# Patient Record
Sex: Female | Born: 1980 | Race: White | Hispanic: No | Marital: Single | Smoking: Never smoker
Health system: Southern US, Community
[De-identification: ages and names within clinical notes are randomized; demographics above are authoritative.]

---

## 2017-05-11 ENCOUNTER — Encounter (HOSPITAL_COMMUNITY): Payer: Self-pay | Admitting: *Deleted

## 2017-05-11 ENCOUNTER — Other Ambulatory Visit: Payer: Self-pay

## 2017-05-11 ENCOUNTER — Emergency Department (HOSPITAL_COMMUNITY)
Admission: EM | Admit: 2017-05-11 | Discharge: 2017-05-11 | Disposition: A | Discharge status: Home / Self Care | Attending: Emergency Medicine | Admitting: Emergency Medicine

## 2017-05-11 DIAGNOSIS — Y929 Unspecified place or not applicable: Secondary | ICD-10-CM | POA: Diagnosis not present

## 2017-05-11 DIAGNOSIS — W260XXA Contact with knife, initial encounter: Secondary | ICD-10-CM | POA: Diagnosis not present

## 2017-05-11 DIAGNOSIS — Y939 Activity, unspecified: Secondary | ICD-10-CM | POA: Diagnosis not present

## 2017-05-11 DIAGNOSIS — Z23 Encounter for immunization: Secondary | ICD-10-CM | POA: Insufficient documentation

## 2017-05-11 DIAGNOSIS — Y999 Unspecified external cause status: Secondary | ICD-10-CM | POA: Insufficient documentation

## 2017-05-11 DIAGNOSIS — S61211A Laceration without foreign body of left index finger without damage to nail, initial encounter: Secondary | ICD-10-CM | POA: Diagnosis present

## 2017-05-11 MED ORDER — TETANUS-DIPHTH-ACELL PERTUSSIS 5-2.5-18.5 LF-MCG/0.5 IM SUSP
0.5000 mL | Freq: Once | INTRAMUSCULAR | Status: AC
  Administered 2017-05-11: 0.5 mL via INTRAMUSCULAR
  Filled 2017-05-11: qty 0.5

## 2017-05-11 MED ORDER — LIDOCAINE HCL (PF) 1 % IJ SOLN
5.0000 mL | Freq: Once | INTRAMUSCULAR | Status: AC
  Administered 2017-05-11: 5 mL
  Filled 2017-05-11: qty 30

## 2017-05-11 NOTE — Discharge Instructions (Addendum)
You were seen in the emergency department today for a laceration to your left second finger.  Wound was clean.  We placed a 6 stitches into the laceration to close it.  Do not get the area wet for the next 24 hours, do not soak the laceration until the stitches are removed.  You will need to have the stitches removed in 7 days, you may have this done at the ER, in urgent care, or by your primary care doctor.  You need to return to the ER sooner for any signs of infection such as redness around the wound, discharge from the wound, fever, chills, or any other concerns that you may have.

## 2017-05-11 NOTE — ED Provider Notes (Signed)
[facility]-EMERGENCY DEPT Provider Note   CSN: [id number] Arrival date & time: 05/11/17  [id number]     History   Chief Complaint Chief Complaint  Patient presents with  . Extremity Laceration    left index finger    HPI Hayley Black is a 37 y.o. female who presents to the ED for laceration to her L 2nd digit that occurred just PTA. Patient was at work utilizing a box cutter when it slipped and cut her finger. States she applied pressure without resolution of bleeding. The area is somewhat uncomfortable, no alleviating/aggravating factors. Unknown last tetanus. Denies numbness or weakness. No other areas of injury. Patient is R hand dominant.  HPI  History reviewed. No pertinent past medical history.  There are no active problems to display for this patient.   History reviewed. No pertinent surgical history.   OB History   None      Home Medications    Prior to Admission medications   Medication Sig Start Date End Date Taking? Authorizing Provider  levonorgestrel-ethinyl estradiol (SEASONALE,INTROVALE,JOLESSA) 0.15-0.03 MG tablet Take 1 tablet by mouth daily. 04/22/17 04/22/18 Yes [provider]    Family History History reviewed. No pertinent family history.  Social History Social History   Tobacco Use  . Smoking status: Never Smoker  . Smokeless tobacco: Never Used  Substance Use Topics  . Alcohol use: Not Currently  . Drug use: Not on file     Allergies   Patient has no known allergies.   Review of Systems Review of Systems  Constitutional: Negative for chills and fever.  Skin: Positive for wound.  Neurological: Negative for weakness and numbness.     Physical Exam Updated Vital Signs BP (!) 142/93 (BP Location: Right Arm)   Pulse 99   Temp 98.4 F (36.9 C) (Oral)   Resp 18   SpO2 100%   Physical Exam  Constitutional: She appears well-developed and well-nourished. No distress.  HENT:  Head: Normocephalic and  atraumatic.  Eyes: Conjunctivae are normal. Right eye exhibits no discharge. Left eye exhibits no discharge.  Cardiovascular:  Pulses:      Radial pulses are 2+ on the right side, and 2+ on the left side.  Musculoskeletal:  Upper extremities: Patient has full range of motion at all joints in the upper extremities with the exception of limited left second digit DIP and PIP flexion secondary to pain.  Patient is able to perform some flexion at both of these joints.  No bony tenderness to palpation.  Neurological: She is alert.  Clear speech.  Sensation grossly intact to bilateral upper extremities.  Patient is able to perform okay sign, thumbs up, and cross second and third digits in bilateral upper extremities.  Skin: Capillary refill takes less than 2 seconds. Laceration (There is a 2.5 cm laceration to the dorsolateral aspect of the left second digit which occurs in the area of the proximal phalanx. no appreciable foreign body. ) noted.  Psychiatric: She has a normal mood and affect. Her behavior is normal. Thought content normal.  Nursing note and vitals reviewed.    ED Treatments / Results  Labs (all labs ordered are listed, but only abnormal results are displayed) Labs Reviewed - No data to display  EKG None  Radiology No results found.  Procedures .Marland Kitchen.Laceration Repair Date/Time: 05/11/2017 9:40 AM Performed by: Cherly AndersonPetrucelli, Yoshie Kosel R, PA-C Authorized by: Cherly AndersonPetrucelli, Laraya Pestka R, PA-C   Consent:    Consent obtained:  Verbal   Consent given  by:  Patient   Risks discussed:  Pain, infection, retained foreign body, poor cosmetic result, tendon damage, vascular damage, poor wound healing and nerve damage   Alternatives discussed:  No treatment Anesthesia (see MAR for exact dosages):    Anesthesia method:  Nerve block   Block needle gauge:  27 G   Block anesthetic:  Lidocaine 1% w/o epi   Block injection procedure:  Anatomic landmarks identified   Block outcome:  Anesthesia  achieved Laceration details:    Location:  Finger   Finger location:  L index finger   Length (cm):  2.5 Repair type:    Repair type:  Simple Pre-procedure details:    Preparation:  Patient was prepped and draped in usual sterile fashion Exploration:    Hemostasis achieved with:  Direct pressure and tourniquet   Wound exploration: wound explored through full range of motion and entire depth of wound probed and visualized     Contaminated: no   Treatment:    Area cleansed with:  Betadine   Amount of cleaning:  Standard   Irrigation solution:  Sterile saline   Irrigation volume:  1L   Irrigation method:  Pressure wash Skin repair:    Repair method:  Sutures   Suture size:  4-0   Suture material:  Nylon   Suture technique:  Simple interrupted   Number of sutures:  6 Approximation:    Approximation:  Close Post-procedure details:    Dressing:  Non-adherent dressing   Patient tolerance of procedure:  Tolerated well, no immediate complications   (including critical care time)  Medications Ordered in ED Medications  lidocaine (PF) (XYLOCAINE) 1 % injection 5 mL (5 mLs Infiltration Given by Other 05/11/17 0900)  Tdap (BOOSTRIX) injection 0.5 mL (0.5 mLs Intramuscular Given 05/11/17 0930)     Initial Impression / Assessment and Plan / ED Course  I have reviewed the triage vital signs and the nursing notes.  Pertinent labs & imaging results that were available during my care of the patient were reviewed by me and considered in my medical decision making (see chart for details).  Patient presents with L 2nd digit laceration. Pressure irrigation performed. Wound explored and base of wound visualized in a bloodless field without evidence of foreign body.  Repair per procedure note above. Tdap updated.  Patient has no comorbidities to effect normal wound healing. Pt discharged without antibiotics.  Discussed suture home care with patient and answered questions. Pt to follow-up for wound  check and suture removal in 7 days; they are to return to the ED sooner for signs of infection. Pt is hemodynamically stable with no complaints prior to dc.    Final Clinical Impressions(s) / ED Diagnoses   Final diagnoses:  Laceration of left index finger without foreign body without damage to nail, initial encounter    ED Discharge Orders    None       Cherly Anderson, PA-C 05/11/17 1556    Azalia Bilis, MD 05/13/17 1428

## 2017-05-11 NOTE — ED Triage Notes (Signed)
Pt cut her left index finger with a box cutter while cutting open pallet at work. Pt covered wound w/ gauze. Laceration is actively bleeding. Pt is unsure when she last had a tetanus vaccination.

## 2017-05-11 NOTE — ED Notes (Signed)
Bed: WTR5 Expected date:  Expected time:  Means of arrival:  Comments: 

## 2017-05-19 ENCOUNTER — Other Ambulatory Visit: Payer: Self-pay

## 2017-05-19 ENCOUNTER — Emergency Department (HOSPITAL_COMMUNITY)
Admission: EM | Admit: 2017-05-19 | Discharge: 2017-05-19 | Disposition: A | Discharge status: Home / Self Care | Attending: Emergency Medicine | Admitting: Emergency Medicine

## 2017-05-19 ENCOUNTER — Encounter (HOSPITAL_COMMUNITY): Payer: Self-pay | Admitting: *Deleted

## 2017-05-19 DIAGNOSIS — S61211D Laceration without foreign body of left index finger without damage to nail, subsequent encounter: Secondary | ICD-10-CM | POA: Insufficient documentation

## 2017-05-19 DIAGNOSIS — X58XXXD Exposure to other specified factors, subsequent encounter: Secondary | ICD-10-CM | POA: Diagnosis not present

## 2017-05-19 DIAGNOSIS — Z79899 Other long term (current) drug therapy: Secondary | ICD-10-CM | POA: Insufficient documentation

## 2017-05-19 DIAGNOSIS — Z4802 Encounter for removal of sutures: Secondary | ICD-10-CM | POA: Diagnosis not present

## 2017-05-19 NOTE — ED Triage Notes (Signed)
For nylon suture removal from left index finger. No issues reported, and the wound is approximated and healing.

## 2017-05-19 NOTE — ED Provider Notes (Signed)
[facility]-EMERGENCY DEPT Provider Note   CSN: [id number] Arrival date & time: 05/19/17  0748     History   Chief Complaint Chief Complaint  Patient presents with  . Suture / Staple Removal    HPI Hayley Black is a 37 y.o. female presenting for suture removal.  Patient states she had sutures placed on the ninth for a lac to the finger.  She has had no issues since, no drainage, significant pain, or bleeding.  No numbness or tingling of the finger, full active range of motion.  She reports some mild soreness.  No concerns at this time.  HPI  History reviewed. No pertinent past medical history.  There are no active problems to display for this patient.   No past surgical history on file.   OB History   None      Home Medications    Prior to Admission medications   Medication Sig Start Date End Date Taking? Authorizing Provider  levonorgestrel-ethinyl estradiol (SEASONALE,INTROVALE,JOLESSA) 0.15-0.03 MG tablet Take 1 tablet by mouth daily. 04/22/17 04/22/18  [provider]    Family History No family history on file.  Social History Social History   Tobacco Use  . Smoking status: Never Smoker  . Smokeless tobacco: Never Used  Substance Use Topics  . Alcohol use: Not Currently  . Drug use: Not on file     Allergies   Patient has no known allergies.   Review of Systems Review of Systems  Skin: Positive for wound.  Neurological: Negative for numbness.  Hematological: Does not bruise/bleed easily.     Physical Exam Updated Vital Signs BP 118/80 (BP Location: Right Arm)   Pulse 79   Temp 98.3 F (36.8 C) (Oral)   Resp 18   SpO2 100%   Physical Exam  Constitutional: She is oriented to person, place, and time. She appears well-developed and well-nourished. No distress.  HENT:  Head: Normocephalic and atraumatic.  Eyes: EOM are normal.  Neck: Normal range of motion.  Pulmonary/Chest: Effort normal.  Abdominal:  She exhibits no distension.  Musculoskeletal: Normal range of motion.  Neurological: She is alert and oriented to person, place, and time.  Skin: Skin is warm. No rash noted.  Well-healing laceration of the index finger without surrounding erythema, drainage, or bleeding.  Psychiatric: She has a normal mood and affect.  Nursing note and vitals reviewed.    ED Treatments / Results  Labs (all labs ordered are listed, but only abnormal results are displayed) Labs Reviewed - No data to display  EKG None  Radiology No results found.  Procedures .Suture Removal Date/Time: 05/19/2017 9:02 AM Performed by: Alveria Apley, PA-C Authorized by: Alveria Apley, PA-C   Consent:    Consent obtained:  Verbal   Consent given by:  Patient   Risks discussed:  Wound separation, pain and bleeding Location:    Location:  Upper extremity   Upper extremity location:  Hand   Hand location:  L index finger Procedure details:    Wound appearance:  No signs of infection, good wound healing, clean and nontender   Number of sutures removed:  6 Post-procedure details:    Post-removal:  Dressing applied   Patient tolerance of procedure:  Tolerated well, no immediate complications   (including critical care time)  Medications Ordered in ED Medications - No data to display   Initial Impression / Assessment and Plan / ED Course  I have reviewed the triage vital signs and the nursing  notes.  Pertinent labs & imaging results that were available during my care of the patient were reviewed by me and considered in my medical decision making (see chart for details).     Patient presenting for evaluation of suture removal.  Physical exam shows well-healing laceration without signs of infection.  Sutures removed, patient tolerated well.  No wound dehiscence or significant bleeding.  Discussed aftercare instructions and follow-up with PCP as needed.  At this time, patient appears safe for discharge.   Return precautions given.  Patient states she understands and agrees to plan.   Final Clinical Impressions(s) / ED Diagnoses   Final diagnoses:  Visit for suture removal    ED Discharge Orders    None       Alveria ApleyCaccavale, Veola Cafaro, PA-C 05/19/17 [id number]    Azalia Bilisampos, Kevin, MD 05/19/17 1650

## 2017-05-19 NOTE — Discharge Instructions (Addendum)
Keep the area clean and covered as needed.  Follow up with your primary care as needed.  Return to the ER if you develop fevers, bleeding, numbness, or any new or concerning symptoms.

## 2018-01-31 ENCOUNTER — Ambulatory Visit (INDEPENDENT_AMBULATORY_CARE_PROVIDER_SITE_OTHER): Payer: Managed Care, Other (non HMO) | Admitting: Podiatry

## 2018-01-31 ENCOUNTER — Other Ambulatory Visit: Payer: Self-pay | Admitting: Podiatry

## 2018-01-31 ENCOUNTER — Ambulatory Visit (INDEPENDENT_AMBULATORY_CARE_PROVIDER_SITE_OTHER): Payer: Managed Care, Other (non HMO)

## 2018-01-31 ENCOUNTER — Encounter: Payer: Self-pay | Admitting: Podiatry

## 2018-01-31 VITALS — BP 120/80 | HR 95

## 2018-01-31 DIAGNOSIS — M722 Plantar fascial fibromatosis: Secondary | ICD-10-CM

## 2018-01-31 DIAGNOSIS — M79672 Pain in left foot: Secondary | ICD-10-CM

## 2018-01-31 NOTE — Patient Instructions (Signed)
Pre-Operative Instructions  Congratulations, you have decided to take an important step towards improving your quality of life.  You can be assured that the doctors and staff at [facility] will be with you every step of the way.  Here are some important things you should know:  1. Plan to be at the surgery center/hospital at least 1 (one) hour prior to your scheduled time, unless otherwise directed by the surgical center/hospital staff.  You must have a responsible adult accompany you, remain during the surgery and drive you home.  Make sure you have directions to the surgical center/hospital to ensure you arrive on time. 2. If you are having surgery at [facility] or [facility] hospitals, you will need a copy of your medical history and physical form from your family physician within one month prior to the date of surgery. We will give you a form for your primary physician to complete.  3. We make every effort to accommodate the date you request for surgery.  However, there are times where surgery dates or times have to be moved.  We will contact you as soon as possible if a change in schedule is required.   4. No aspirin/ibuprofen for one week before surgery.  If you are on aspirin, any non-steroidal anti-inflammatory medications (Mobic, Aleve, Ibuprofen) should not be taken seven (7) days prior to your surgery.  You make take Tylenol for pain prior to surgery.  5. Medications - If you are taking daily heart and blood pressure medications, seizure, reflux, allergy, asthma, anxiety, pain or diabetes medications, make sure you notify the surgery center/hospital before the day of surgery so they can tell you which medications you should take or avoid the day of surgery. 6. No food or drink after midnight the night before surgery unless directed otherwise by surgical center/hospital staff. 7. No alcoholic beverages 24-hours prior to surgery.  No smoking 24-hours prior or 24-hours after  surgery. 8. Wear loose pants or shorts. They should be loose enough to fit over bandages, boots, and casts. 9. Don't wear slip-on shoes. Sneakers are preferred. 10. Bring your boot with you to the surgery center/hospital.  Also bring crutches or a walker if your physician has prescribed it for you.  If you do not have this equipment, it will be provided for you after surgery. 11. If you have not been contacted by the surgery center/hospital by the day before your surgery, call to confirm the date and time of your surgery. 12. Leave-time from work may vary depending on the type of surgery you have.  Appropriate arrangements should be made prior to surgery with your employer. 13. Prescriptions will be provided immediately following surgery by your doctor.  Fill these as soon as possible after surgery and take the medication as directed. Pain medications will not be refilled on weekends and must be approved by the doctor. 14. Remove nail polish on the operative foot and avoid getting pedicures prior to surgery. 15. Wash the night before surgery.  The night before surgery wash the foot and leg well with water and the antibacterial soap provided. Be sure to pay special attention to beneath the toenails and in between the toes.  Wash for at least three (3) minutes. Rinse thoroughly with water and dry well with a towel.  Perform this wash unless told not to do so by your physician.  Enclosed: 1 Ice pack (please put in freezer the night before surgery)   1 Hibiclens skin cleaner     Pre-op instructions  If you have any questions regarding the instructions, please do not hesitate to call our office.  [place]: [address], [place], [place] [address] -- [phone]  [place]: [address] [place], [place] [address] -- [phone]  [place]: [address]  [place], [place] [address] -- [phone]  [place]: [address], [address], [place],  [address] -- [phone]  Website:  [email or web address] 

## 2018-02-02 NOTE — Progress Notes (Signed)
   Subjective: 38 year old female presenting today as a new patient, referred by Dr. Elijah Birk, with a chief complaint of constant nagging pain to the plantar aspect of the left heel that began 1-2 years ago. She states she has had injections and gotten insoles from Dr. Elijah Birk which have not provided any relief. She states she is on her feet all day which makes the pain worse. Patient is here for further evaluation and treatment.   History reviewed. No pertinent past medical history.    Objective: Physical Exam General: The patient is alert and oriented x3 in no acute distress.  Dermatology: Skin is warm, dry and supple bilateral lower extremities. Negative for open lesions or macerations bilateral.   Vascular: Dorsalis Pedis and Posterior Tibial pulses palpable bilateral.  Capillary fill time is immediate to all digits.  Neurological: Epicritic and protective threshold intact bilateral.   Musculoskeletal: Tenderness to palpation to the plantar aspect of the left heel along the plantar fascia. All other joints range of motion within normal limits bilateral. Strength 5/5 in all groups bilateral.   Radiographic exam:   Normal osseous mineralization. Joint spaces preserved. No fracture/dislocation/boney destruction. No other soft tissue abnormalities or radiopaque foreign bodies.   Assessment: 1. Plantar fasciitis left foot  Plan of Care:  1. Patient evaluated. Xrays reviewed.   2. Today we discussed the conservative versus surgical management of the presenting pathology. The patient opts for surgical management. All possible complications and details of the procedure were explained. All patient questions were answered. No guarantees were expressed or implied. 3. Authorization for surgery was initiated today. Surgery will consist of EPF left.  4. Return to clinic one week post op.     Felecia Shelling, DPM [facility]  Dr. Felecia Shelling, DPM    [address] [place], [place] [address]                Office [phone]  Fax [phone]

## 2018-03-02 DIAGNOSIS — M79676 Pain in unspecified toe(s): Secondary | ICD-10-CM

## 2018-03-24 ENCOUNTER — Encounter: Payer: Self-pay | Admitting: Podiatry

## 2018-03-24 ENCOUNTER — Other Ambulatory Visit: Payer: Self-pay | Admitting: Podiatry

## 2018-03-24 DIAGNOSIS — M722 Plantar fascial fibromatosis: Secondary | ICD-10-CM

## 2018-03-24 MED ORDER — OXYCODONE-ACETAMINOPHEN 5-325 MG PO TABS: 1.0000 | ORAL_TABLET | Freq: Four times a day (QID) | ORAL | 0 refills | Status: DC | PRN

## 2018-03-24 NOTE — Progress Notes (Signed)
.  postop

## 2018-04-04 ENCOUNTER — Ambulatory Visit (INDEPENDENT_AMBULATORY_CARE_PROVIDER_SITE_OTHER): Payer: Self-pay | Admitting: Podiatry

## 2018-04-04 DIAGNOSIS — M722 Plantar fascial fibromatosis: Secondary | ICD-10-CM

## 2018-04-04 DIAGNOSIS — Z9889 Other specified postprocedural states: Secondary | ICD-10-CM

## 2018-04-06 NOTE — Progress Notes (Signed)
   Subjective:  Patient presents today status post EPF left. DOS: 03/24/2018. She reports some soreness to the plantar aspect of the left foot. She states the pain has improved from what it was prior to surgery. She denies modifying factors. She has been using the CAM boot as directed. She denies fever, chills, nausea or vomiting. Patient is here for further evaluation and treatment.    No past medical history on file.    Objective/Physical Exam Neurovascular status intact.  Skin incisions appear to be well coapted with sutures and staples intact. No sign of infectious process noted. No dehiscence. No active bleeding noted. Moderate edema noted to the surgical extremity.  Assessment: 1. s/p EPF left. DOS: 03/24/2018   Plan of Care:  1. Patient was evaluated. 2. Recommended antibiotic ointment daily with a bandage.  3. Continue weightbearing in CAM boot.  4. Return to clinic in one week for suture removal.    Felecia Shelling, DPM [facility]  Dr. Felecia Shelling, DPM    [address]                                        [place], [place] [address]                Office [phone]  Fax [phone]

## 2018-04-11 ENCOUNTER — Ambulatory Visit (INDEPENDENT_AMBULATORY_CARE_PROVIDER_SITE_OTHER): Payer: Self-pay | Admitting: Podiatry

## 2018-04-11 DIAGNOSIS — M722 Plantar fascial fibromatosis: Secondary | ICD-10-CM

## 2018-04-11 DIAGNOSIS — Z9889 Other specified postprocedural states: Secondary | ICD-10-CM

## 2018-04-12 ENCOUNTER — Encounter: Payer: Self-pay | Admitting: Podiatry

## 2018-04-13 NOTE — Telephone Encounter (Signed)
Patient can begin transitioning out of the boot into good tennis shoes over the next 2 weeks.

## 2018-04-14 NOTE — Progress Notes (Signed)
   Subjective:  Patient presents today status post EPF left. DOS: 03/24/2018. She states she is doing well overall. She reports some intermittent soreness that is mild in nature. She has been using the CAM boot as directed. Patient is here for further evaluation and treatment.    No past medical history on file.    Objective/Physical Exam Neurovascular status intact.  Skin incisions appear to be well coapted with sutures and staples intact. No sign of infectious process noted. No dehiscence. No active bleeding noted. Moderate edema noted to the surgical extremity.  Assessment: 1. s/p EPF left. DOS: 03/24/2018   Plan of Care:  1. Patient was evaluated. 2. Sutures removed.  3. Transition out of CAM boot into good shoe gear in two weeks.  4. Compression anklet dispensed.  5. Return to clinic in 4 weeks. At that time we will return to work with restrictions.   Works at [employer] on [address].    Felecia Shelling, DPM [facility]  Dr. Felecia Shelling, DPM    [address]                                        [place], [place] [address]                Office [phone]  Fax [phone]

## 2018-04-25 ENCOUNTER — Encounter: Payer: Managed Care, Other (non HMO) | Admitting: Podiatry

## 2018-05-09 ENCOUNTER — Other Ambulatory Visit: Payer: Self-pay

## 2018-05-09 ENCOUNTER — Ambulatory Visit (INDEPENDENT_AMBULATORY_CARE_PROVIDER_SITE_OTHER): Payer: Managed Care, Other (non HMO) | Admitting: Podiatry

## 2018-05-09 ENCOUNTER — Encounter: Payer: Self-pay | Admitting: Podiatry

## 2018-05-09 VITALS — Temp 97.7°F

## 2018-05-09 DIAGNOSIS — Z9889 Other specified postprocedural states: Secondary | ICD-10-CM

## 2018-05-09 DIAGNOSIS — M722 Plantar fascial fibromatosis: Secondary | ICD-10-CM

## 2018-05-09 NOTE — Progress Notes (Signed)
   Subjective:  Patient presents today status post EPF left. DOS: 03/24/2018. She states she is doing well overall. She reports some intermittent soreness that is mild in nature.  She is recently noticed some pain to the lateral aspect of the left forefoot when walking barefoot around the house.  She has slowly been transitioning out of the immobilization cam boot    No past medical history on file.    Objective/Physical Exam Neurovascular status intact.  Skin incisions appear to be well coapted. No sign of infectious process noted. Moderate edema noted to the surgical extremity.  Negative for any tenderness to palpation along the insertion of the plantar fascia along the plantar heel.  Minimal tenderness to palpation overlying the neck of the fifth metatarsal left foot  Assessment: 1. s/p EPF left. DOS: 03/24/2018   Plan of Care:  1. Patient was evaluated. 2.  Patient may now resume full activity no restrictions beginning 05/20/2018 when she returns to work.  If the patient does have some residual tenderness and pain and is uncomfortable returning to work she may need an additional 4 weeks refraining from work to recover.  She will call into our office if she needs to have that extended. 3.  Resume good supportive tennis shoes.  Discontinue the immobilization cam boot 4.  Return to clinic as needed  Works at [employer] on [address].    Felecia Shelling, DPM [facility]  Dr. Felecia Shelling, DPM    [address]                                        [place], [place] [address]                Office [phone]  Fax [phone]

## 2018-05-10 ENCOUNTER — Encounter: Payer: Self-pay | Admitting: Podiatry

## 2018-05-16 ENCOUNTER — Telehealth: Payer: Self-pay | Admitting: Podiatry

## 2018-05-16 NOTE — Telephone Encounter (Signed)
error 

## 2018-06-15 ENCOUNTER — Ambulatory Visit (INDEPENDENT_AMBULATORY_CARE_PROVIDER_SITE_OTHER): Payer: Managed Care, Other (non HMO) | Admitting: Podiatry

## 2018-06-15 ENCOUNTER — Encounter: Payer: Self-pay | Admitting: Podiatry

## 2018-06-15 ENCOUNTER — Other Ambulatory Visit: Payer: Self-pay

## 2018-06-15 VITALS — Temp 97.5°F

## 2018-06-15 DIAGNOSIS — M779 Enthesopathy, unspecified: Secondary | ICD-10-CM

## 2018-06-15 DIAGNOSIS — M722 Plantar fascial fibromatosis: Secondary | ICD-10-CM

## 2018-06-15 DIAGNOSIS — Z9889 Other specified postprocedural states: Secondary | ICD-10-CM

## 2018-06-15 DIAGNOSIS — M778 Other enthesopathies, not elsewhere classified: Secondary | ICD-10-CM

## 2018-06-15 MED ORDER — METHYLPREDNISOLONE 4 MG PO TBPK: ORAL_TABLET | ORAL | 0 refills | Status: AC

## 2018-06-19 NOTE — Progress Notes (Signed)
   Subjective:  Patient presents today status post EPF left. DOS: 03/24/2018. She states she went back to work one week ago and after a couple of days she started experiencing significant pain again. Standing and walking for long periods of time increases the pain. She has been taking Duexis for treatment. Patient is here for further evaluation and treatment.    History reviewed. No pertinent past medical history.    Objective/Physical Exam Neurovascular status intact.  Skin incisions appear to be well coapted. No sign of infectious process noted. Moderate edema noted to the surgical extremity. Pain with palpation noted to the left foot.   Assessment: 1. s/p EPF left. DOS: 03/24/2018 2. Capsulitis left foot secondary to overuse injury    Plan of Care:  1. Patient was evaluated. 2. Prescription for Medrol Dose Pak provided to patient. Then continue taking Duexis from Dr. Elijah Birk.  3. Resume using custom orthotics from Dr. Tasia Catchings office.  4. Compression anklet dispensed.  5. Restricted work hours. 5 hour shifts for four weeks.  6. Return to clinic in 4 weeks.    Works at [employer] on [address].    Felecia Shelling, DPM [facility]  Dr. Felecia Shelling, DPM    [address]                                        [place], [place] [address]                Office [phone]  Fax [phone]

## 2018-07-20 ENCOUNTER — Ambulatory Visit (INDEPENDENT_AMBULATORY_CARE_PROVIDER_SITE_OTHER): Payer: Managed Care, Other (non HMO)

## 2018-07-20 ENCOUNTER — Ambulatory Visit (INDEPENDENT_AMBULATORY_CARE_PROVIDER_SITE_OTHER): Payer: Managed Care, Other (non HMO) | Admitting: Podiatry

## 2018-07-20 ENCOUNTER — Encounter: Payer: Self-pay | Admitting: Podiatry

## 2018-07-20 ENCOUNTER — Other Ambulatory Visit: Payer: Self-pay

## 2018-07-20 ENCOUNTER — Other Ambulatory Visit: Payer: Self-pay | Admitting: Podiatry

## 2018-07-20 VITALS — Temp 97.9°F

## 2018-07-20 DIAGNOSIS — M779 Enthesopathy, unspecified: Secondary | ICD-10-CM

## 2018-07-20 DIAGNOSIS — M7672 Peroneal tendinitis, left leg: Secondary | ICD-10-CM

## 2018-07-23 NOTE — Progress Notes (Signed)
   HPI: 38 year old female presenting today for follow up evaluation of left foot pain. He states the pain has not changed since his last visit. He denies any modifying factors. He has been taking Duexis and using the custom orthotics he received from Dr. Gershon Mussel. Patient is here for further evaluation and treatment.   History reviewed. No pertinent past medical history.   Physical Exam: General: The patient is alert and oriented x3 in no acute distress.  Dermatology: Skin is warm, dry and supple bilateral lower extremities. Negative for open lesions or macerations.  Vascular: Palpable pedal pulses bilaterally. No edema or erythema noted. Capillary refill within normal limits.  Neurological: Epicritic and protective threshold grossly intact bilaterally.   Musculoskeletal Exam: Pain with palpation to the insertion of the peroneal tendon of the left foot. Range of motion within normal limits to all pedal and ankle joints bilateral. Muscle strength 5/5 in all groups bilateral.   Radiographic Exam:  Normal osseous mineralization. Joint spaces preserved. No fracture/dislocation/boney destruction.    Assessment: 1. Insertional peroneal tendinitis left    Plan of Care:  1. Patient evaluated. X-Rays reviewed.  2. Injection of 0.5 mLs Celestone Soluspan injected into the insertion of the peroneal tendon sheath of the left foot.  3. Continue using custom orthotics from Dr. Lindley Magnus office.  4. Continue taking Duexis as needed.  5. Restricted work hours. Five hour shifts for four weeks.  6. Return to clinic in 4 weeks.   Works at [employer] on [employer].      [name], DPM [facility]  Dr. [name], DPM    [address], [place] [address]                Office [phone]  Fax [phone]

## 2018-08-10 ENCOUNTER — Encounter: Payer: Self-pay | Admitting: Podiatry

## 2018-08-10 ENCOUNTER — Telehealth: Payer: Self-pay | Admitting: *Deleted

## 2018-08-10 ENCOUNTER — Ambulatory Visit (INDEPENDENT_AMBULATORY_CARE_PROVIDER_SITE_OTHER): Payer: Managed Care, Other (non HMO) | Admitting: Podiatry

## 2018-08-10 ENCOUNTER — Other Ambulatory Visit: Payer: Self-pay

## 2018-08-10 VITALS — Temp 97.4°F

## 2018-08-10 DIAGNOSIS — M7672 Peroneal tendinitis, left leg: Secondary | ICD-10-CM | POA: Diagnosis not present

## 2018-08-10 DIAGNOSIS — M722 Plantar fascial fibromatosis: Secondary | ICD-10-CM

## 2018-08-10 DIAGNOSIS — M779 Enthesopathy, unspecified: Secondary | ICD-10-CM

## 2018-08-10 NOTE — Telephone Encounter (Signed)
Orders to J. Quintana, RN for pre-cert and faxed to [facility]. 

## 2018-08-10 NOTE — Telephone Encounter (Signed)
-----   Message from [name], DPM sent at 08/10/2018 10:51 AM EDT ----- Regarding: MRI left foot Please order MRI left foot w/out contrast.   Dx: peroneal tendinitis left  Thanks, Dr. Amalia Hailey

## 2018-08-15 NOTE — Progress Notes (Signed)
   HPI: 38 year old female presenting today for follow up evaluation of insertional peroneal tendinitis of the left foot. She reports continued pain. She states the injection provided relief for about one week. Standing and walking for long periods of time increases the pain. She has been taking Duexis for pain. Patient is here for further evaluation and treatment.    History reviewed. No pertinent past medical history.   Physical Exam: General: The patient is alert and oriented x3 in no acute distress.  Dermatology: Skin is warm, dry and supple bilateral lower extremities. Negative for open lesions or macerations.  Vascular: Palpable pedal pulses bilaterally. No edema or erythema noted. Capillary refill within normal limits.  Neurological: Epicritic and protective threshold grossly intact bilaterally.   Musculoskeletal Exam: Pain with palpation to the insertion of the peroneal tendon of the left foot. Range of motion within normal limits to all pedal and ankle joints bilateral. Muscle strength 5/5 in all groups bilateral.   Assessment: 1. Insertional peroneal tendinitis left    Plan of Care:  1. Patient evaluated.  2. Injection of 0.5 mLs Celestone Soluspan injected into the insertion of the peroneal tendon sheath of the left foot.  3. Order for MRI of the left foot placed.  4. Return to clinic in 4 weeks.    Works at [employer] on [employer].      [name], DPM [facility]  Dr. [name], DPM    [address], [place] [address]                Office [phone]  Fax [phone]

## 2018-08-25 ENCOUNTER — Other Ambulatory Visit: Payer: Self-pay

## 2018-09-06 NOTE — Telephone Encounter (Addendum)
[employer] states MRI denied due to 6 weeks of physician directed PT has not been performed. I informed pt that the 1st office visit concerning the peroneal tendonitis was 08/10/2018 and that would be the start time for the 6 weeks of "Physician Directed PT", so she would need to be reevaluated after 09/20/2018 for update of status to reapply for the MRI. Pt states has an appt tomorrow. I told pt that would be good, it would all for more information to be applied to the request for the MRI and then reevaluation after 09/20/2018. Pt states understanding.

## 2018-09-07 ENCOUNTER — Other Ambulatory Visit: Payer: Self-pay

## 2018-09-07 ENCOUNTER — Telehealth: Payer: Self-pay | Admitting: *Deleted

## 2018-09-07 ENCOUNTER — Encounter: Payer: Self-pay | Admitting: Podiatry

## 2018-09-07 ENCOUNTER — Ambulatory Visit (INDEPENDENT_AMBULATORY_CARE_PROVIDER_SITE_OTHER): Payer: Managed Care, Other (non HMO) | Admitting: Podiatry

## 2018-09-07 VITALS — Temp 98.1°F

## 2018-09-07 DIAGNOSIS — M7672 Peroneal tendinitis, left leg: Secondary | ICD-10-CM | POA: Diagnosis not present

## 2018-09-08 NOTE — Telephone Encounter (Signed)
-----   Message from [name], DPM sent at 09/07/2018 10:24 AM EDT ----- Regarding: MRI denied Hi Hayley Black,   I just saw this patient. Her MRI was denied. Could we arrange a peer-to-peer to see if we could get it approved?  Thanks, Dr. Amalia Hailey

## 2018-09-08 NOTE — Telephone Encounter (Addendum)
Carin Primrose transferred to Physician Support Team - Nikki Dom., he scheduled PEER TO PEER for 09/12/2018 at 12:00pm with DR. Sabeen Razzaq-Ahmed for Reference/Case:  [id number]. Faxed to [place] office and routed the [place] to Dr. Amalia Hailey.

## 2018-09-09 NOTE — Telephone Encounter (Signed)
Thank you :)

## 2018-09-10 NOTE — Progress Notes (Signed)
   HPI: 38 year old female presenting today for follow up evaluation of insertional peroneal tendinitis of the left foot. She states her pain is relatively unchanged. She was not approved for the MRI through insurance. She has been taking Duexis for pain and trying to stay off the foot as much as possible as this exacerbates her pain. Patient is here for further evaluation and treatment.    History reviewed. No pertinent past medical history.   Physical Exam: General: The patient is alert and oriented x3 in no acute distress.  Dermatology: Skin is warm, dry and supple bilateral lower extremities. Negative for open lesions or macerations.  Vascular: Palpable pedal pulses bilaterally. No edema or erythema noted. Capillary refill within normal limits.  Neurological: Epicritic and protective threshold grossly intact bilaterally.   Musculoskeletal Exam: Pain with palpation to the insertion of the peroneal tendon of the left foot. Range of motion within normal limits to all pedal and ankle joints bilateral. Muscle strength 5/5 in all groups bilateral.   Assessment: 1. Insertional peroneal tendinitis left    Plan of Care:  1. Patient evaluated.  2. Physical therapy three times weekly for four weeks.  3. MRI denied. I will attempt peer-to-peer.  4. Return to clinic in 4 weeks.     Works at [employer] on [employer].      [name], DPM [facility]  Dr. [name], DPM    [address], [place] [address]                Office [phone]  Fax [phone]

## 2018-09-12 NOTE — Telephone Encounter (Signed)
Faxed orders for the left foot MRI to [place] with the authorization. I informed pt the MRI had been authorized and she could call [place] [phone]. Pt asked if she needed to keep the PT appt tomorrow. Dr. Amalia Hailey stated to postpone the appt until the MRI was reviewed.

## 2018-09-12 NOTE — Telephone Encounter (Signed)
[facility] - Dr. Sharlene Dory Razzaq-Ahmed and Dr. Amalia Hailey performed PEER TO PEER. Authorization for the MRI 16579 left foot:  [id number].

## 2018-09-17 ENCOUNTER — Ambulatory Visit
Admission: RE | Admit: 2018-09-17 | Discharge: 2018-09-17 | Disposition: A | Discharge status: Home / Self Care | Payer: Self-pay | Source: Ambulatory Visit | Attending: Podiatry | Admitting: Podiatry

## 2018-09-17 ENCOUNTER — Other Ambulatory Visit: Payer: Self-pay

## 2018-10-05 ENCOUNTER — Other Ambulatory Visit: Payer: Self-pay

## 2018-10-05 ENCOUNTER — Ambulatory Visit (INDEPENDENT_AMBULATORY_CARE_PROVIDER_SITE_OTHER): Payer: Managed Care, Other (non HMO) | Admitting: Podiatry

## 2018-10-05 DIAGNOSIS — M7672 Peroneal tendinitis, left leg: Secondary | ICD-10-CM | POA: Diagnosis not present

## 2018-10-05 DIAGNOSIS — M7752 Other enthesopathy of left foot: Secondary | ICD-10-CM

## 2018-10-05 NOTE — Patient Instructions (Signed)
Pre-Operative Instructions  Congratulations, you have decided to take an important step towards improving your quality of life.  You can be assured that the doctors and staff at [facility] will be with you every step of the way.  Here are some important things you should know:  1. Plan to be at the surgery center/hospital at least 1 (one) hour prior to your scheduled time, unless otherwise directed by the surgical center/hospital staff.  You must have a responsible adult accompany you, remain during the surgery and drive you home.  Make sure you have directions to the surgical center/hospital to ensure you arrive on time. 2. If you are having surgery at [facility] or [facility], you will need a copy of your medical history and physical form from your family physician within one month prior to the date of surgery. We will give you a form for your primary physician to complete.  3. We make every effort to accommodate the date you request for surgery.  However, there are times where surgery dates or times have to be moved.  We will contact you as soon as possible if a change in schedule is required.   4. No aspirin/ibuprofen for one week before surgery.  If you are on aspirin, any non-steroidal anti-inflammatory medications (Mobic, Aleve, Ibuprofen) should not be taken seven (7) days prior to your surgery.  You make take Tylenol for pain prior to surgery.  5. Medications - If you are taking daily heart and blood pressure medications, seizure, reflux, allergy, asthma, anxiety, pain or diabetes medications, make sure you notify the surgery center/hospital before the day of surgery so they can tell you which medications you should take or avoid the day of surgery. 6. No food or drink after midnight the night before surgery unless directed otherwise by surgical center/hospital staff. 7. No alcoholic beverages 24-hours prior to surgery.  No smoking 24-hours prior or 24-hours after  surgery. 8. Wear loose pants or shorts. They should be loose enough to fit over bandages, boots, and casts. 9. Don't wear slip-on shoes. Sneakers are preferred. 10. Bring your boot with you to the surgery center/hospital.  Also bring crutches or a walker if your physician has prescribed it for you.  If you do not have this equipment, it will be provided for you after surgery. 11. If you have not been contacted by the surgery center/hospital by the day before your surgery, call to confirm the date and time of your surgery. 12. Leave-time from work may vary depending on the type of surgery you have.  Appropriate arrangements should be made prior to surgery with your employer. 13. Prescriptions will be provided immediately following surgery by your doctor.  Fill these as soon as possible after surgery and take the medication as directed. Pain medications will not be refilled on weekends and must be approved by the doctor. 14. Remove nail polish on the operative foot and avoid getting pedicures prior to surgery. 15. Wash the night before surgery.  The night before surgery wash the foot and leg well with water and the antibacterial soap provided. Be sure to pay special attention to beneath the toenails and in between the toes.  Wash for at least three (3) minutes. Rinse thoroughly with water and dry well with a towel.  Perform this wash unless told not to do so by your physician.  Enclosed: 1 Ice pack (please put in freezer the night before surgery)   1 Hibiclens skin cleaner     Pre-op instructions  If you have any questions regarding the instructions, please do not hesitate to call our office.  [place]: [address], [place], [place] [address] -- [phone]  [place]: [address] [place], [place] [address] -- [phone]  [place]: [address]  [place], [place] [address] -- [phone]  [place]: [address], [place], [place] [address] -- [phone]  Website:  [email or web address] 

## 2018-10-09 NOTE — Progress Notes (Signed)
   HPI: 38 year old female presenting today for follow up evaluation of left foot pain. She reports continued pain. She has been doing physical therapy as directed. She denies any modifying factors at this time. She had an MRI done on 09/17/2018. Patient is here for further evaluation and treatment.   No past medical history on file.   Physical Exam: General: The patient is alert and oriented x3 in no acute distress.  Dermatology: Skin is warm, dry and supple bilateral lower extremities. Negative for open lesions or macerations.  Vascular: Palpable pedal pulses bilaterally. No edema or erythema noted. Capillary refill within normal limits.  Neurological: Epicritic and protective threshold grossly intact bilaterally.   Musculoskeletal Exam: Pain with palpation to the peroneal tendon of the left foot. Range of motion within normal limits to all pedal and ankle joints bilateral. Muscle strength 5/5 in all groups bilateral.   MRI Impression:  1. 7 x 4 x 13 mm fluid collection between the os peroneum and distal peroneal brevis tendon which could represent a ganglion cyst or sequelae of chronic irritation related to the os peroneum. The peroneal tendons are unremarkable. 2. Prior plantar fascia release.  Assessment: 1. Peroneal tendinitis left  2. Os peroneum left    Plan of Care:  1. Patient evaluated. MRI reviewed.  2. Today we discussed the conservative versus surgical management of the presenting pathology. The patient opts for surgical management. All possible complications and details of the procedure were explained. All patient questions were answered. No guarantees were expressed or implied. 3. Authorization for surgery was initiated today. Surgery will consist of excision os peroneum left; possible repair peroneal tendon left.  4. Return to clinic one week post op.      Works at [employer] on [employer].      [name], DPM [facility]  Dr. [name], DPM     [address], [place] [address]                Office [phone]  Fax [phone]

## 2018-10-21 DIAGNOSIS — M79676 Pain in unspecified toe(s): Secondary | ICD-10-CM

## 2018-11-02 ENCOUNTER — Telehealth: Payer: Self-pay | Admitting: Podiatry

## 2018-11-02 NOTE — Telephone Encounter (Signed)
DOS: 11/17/2018 SURGICAL PROCEDURE: TARSAL EXOSTECTOMY LT, REPAIR PERONEAL TENDON LT CPT CODES: 17471, 28200 DX CODES: M77.52, M76.72   Per [facility] with [employer],  Patients In Network Deductible is $2,800 and $2,800 has been met. Patients Individual Out of Pocket is $3,900 and $3,266.90 has been met.               Family Out of Pocket is $7,900 and $3,292.72 has been met.  Benefits are 90% for insurance and 10% for patient. Will be 100% once individual or family out of pocket has been met.  No Pre Cert or Referrals are required.   Reference Number: [id number]

## 2018-11-08 ENCOUNTER — Telehealth: Payer: Self-pay | Admitting: *Deleted

## 2018-11-08 NOTE — Telephone Encounter (Signed)
"  I'm scheduled for surgery on Thursday of next week.  I haven't received a phone call about the time yet."  You will receive a call from someone from the surgical center a day or two prior to your surgery date.  They will give you your arrival time.  "Oh okay, thanks."

## 2018-11-17 ENCOUNTER — Encounter: Payer: Self-pay | Admitting: Podiatry

## 2018-11-17 ENCOUNTER — Other Ambulatory Visit: Payer: Self-pay | Admitting: Podiatry

## 2018-11-17 DIAGNOSIS — M25775 Osteophyte, left foot: Secondary | ICD-10-CM

## 2018-11-17 DIAGNOSIS — M7672 Peroneal tendinitis, left leg: Secondary | ICD-10-CM

## 2018-11-17 MED ORDER — OXYCODONE-ACETAMINOPHEN 5-325 MG PO TABS: 1.0000 | ORAL_TABLET | ORAL | 0 refills | Status: AC | PRN

## 2018-11-17 NOTE — Progress Notes (Signed)
.  postop

## 2018-11-18 ENCOUNTER — Other Ambulatory Visit: Payer: Self-pay

## 2018-11-18 ENCOUNTER — Ambulatory Visit: Payer: Managed Care, Other (non HMO)

## 2018-11-18 DIAGNOSIS — M7672 Peroneal tendinitis, left leg: Secondary | ICD-10-CM

## 2018-11-18 NOTE — Progress Notes (Signed)
Patient is here today concerned about postoperative bleeding.  She had surgery yesterday, and has noticed blood on her Ace bandage in her boot.  Noted moderate amount of blood to postoperative bandages, some images were removed, did not remove sterile field bandages.  Applied additional gauze along with compressive Ace wrap.  Advised to remain in the boot, discussed signs and symptoms of excessive bleeding.  She is to follow-up if any acute symptom changes.

## 2018-11-23 ENCOUNTER — Encounter: Payer: Self-pay | Admitting: Podiatry

## 2018-11-23 ENCOUNTER — Other Ambulatory Visit: Payer: Self-pay

## 2018-11-23 ENCOUNTER — Ambulatory Visit (INDEPENDENT_AMBULATORY_CARE_PROVIDER_SITE_OTHER): Payer: Self-pay | Admitting: Podiatry

## 2018-11-23 ENCOUNTER — Ambulatory Visit (INDEPENDENT_AMBULATORY_CARE_PROVIDER_SITE_OTHER): Payer: Managed Care, Other (non HMO)

## 2018-11-23 VITALS — Temp 98.2°F

## 2018-11-23 DIAGNOSIS — Z9889 Other specified postprocedural states: Secondary | ICD-10-CM | POA: Diagnosis not present

## 2018-11-23 DIAGNOSIS — M7752 Other enthesopathy of left foot: Secondary | ICD-10-CM

## 2018-11-23 DIAGNOSIS — M7672 Peroneal tendinitis, left leg: Secondary | ICD-10-CM

## 2018-11-27 NOTE — Progress Notes (Signed)
   Subjective:  Patient presents today status post excision of os peroneum left. DOS: 11/17/2018. She states she is doing well and improving. She has been taking Motrin as needed which has helped alleviate any pain. She denies worsening factors. Patient is here for further evaluation and treatment.    History reviewed. No pertinent past medical history.    Objective/Physical Exam Neurovascular status intact.  Skin incisions appear to be well coapted with sutures and staples intact. No sign of infectious process noted. No dehiscence. No active bleeding noted. Moderate edema noted to the surgical extremity.  Radiographic Exam:  Orthopedic hardware and osteotomies sites appear to be stable with routine healing.  Assessment: 1. s/p excision os peroneum left. DOS: 11/17/2018   Plan of Care:  1. Patient was evaluated. X-rays reviewed 2. Dressing changed. Keep clean, dry and intact for one week.  3. Continue weightbearing in CAM boot.  4. Return to clinic in one week.    [name], DPM [facility]  Dr. [name], [address]                                        [place], [place] [address]                Office [phone]  Fax [phone]

## 2018-11-28 ENCOUNTER — Telehealth: Payer: Self-pay | Admitting: *Deleted

## 2018-11-28 NOTE — Telephone Encounter (Signed)
"  I'm calling to see if Hayley Black had her surgery on 11/17/2018."  Yes, she did.  "Did she have a Peroneal Tendon Repair and a Tarsal Exostectomy?"  Yes, she did have those procedures.  "Perfect, has she already had a follow-up appointment?  If so, when did she have it?"  She had a follow-up appointment on 11/23/2018.  "That's all I needed to know."

## 2018-11-29 ENCOUNTER — Telehealth: Payer: Self-pay | Admitting: Podiatry

## 2018-11-30 ENCOUNTER — Other Ambulatory Visit: Payer: Self-pay

## 2018-11-30 ENCOUNTER — Encounter: Payer: Self-pay | Admitting: Podiatry

## 2018-11-30 ENCOUNTER — Ambulatory Visit (INDEPENDENT_AMBULATORY_CARE_PROVIDER_SITE_OTHER): Payer: Self-pay | Admitting: Podiatry

## 2018-11-30 DIAGNOSIS — M7752 Other enthesopathy of left foot: Secondary | ICD-10-CM

## 2018-11-30 DIAGNOSIS — Z9889 Other specified postprocedural states: Secondary | ICD-10-CM

## 2018-11-30 DIAGNOSIS — M7672 Peroneal tendinitis, left leg: Secondary | ICD-10-CM

## 2018-11-30 NOTE — Telephone Encounter (Signed)
Following up on a medical records request. We are needing the medical records from 11/17/2018 - Present. Please fax those with reference# [id number] to [phone].

## 2018-12-04 NOTE — Progress Notes (Signed)
   Subjective:  Patient presents today status post excision of os peroneum left. DOS: 11/17/2018. She states she is doing well and has improved since her last visit. She has been nonweightbearing in the CAM boot as directed. There are no modifying factors noted. Patient is here for further evaluation and treatment.    History reviewed. No pertinent past medical history.    Objective/Physical Exam Neurovascular status intact.  Skin incisions appear to be well coapted with sutures and staples intact. No sign of infectious process noted. No dehiscence. No active bleeding noted. Moderate edema noted to the surgical extremity.   Assessment: 1. s/p excision os peroneum left. DOS: 11/17/2018   Plan of Care:  1. Patient was evaluated.  2. Sutures removed.  3. Continue nonweightbearing in CAM boot.  4. Compression anklet dispensed.  5. Return to clinic in 2 weeks to initiate physical therapy and weightbearing in CAM boot.    [name], DPM [facility]  Dr. [name], [address]                                        [place], [place] [address]                Office [phone]  Fax [phone]

## 2018-12-14 ENCOUNTER — Ambulatory Visit (INDEPENDENT_AMBULATORY_CARE_PROVIDER_SITE_OTHER): Payer: Managed Care, Other (non HMO) | Admitting: Podiatry

## 2018-12-14 ENCOUNTER — Ambulatory Visit (INDEPENDENT_AMBULATORY_CARE_PROVIDER_SITE_OTHER): Payer: Managed Care, Other (non HMO)

## 2018-12-14 ENCOUNTER — Other Ambulatory Visit: Payer: Self-pay

## 2018-12-14 DIAGNOSIS — Z9889 Other specified postprocedural states: Secondary | ICD-10-CM

## 2018-12-14 DIAGNOSIS — M7752 Other enthesopathy of left foot: Secondary | ICD-10-CM

## 2018-12-16 ENCOUNTER — Telehealth: Payer: Self-pay | Admitting: Podiatry

## 2018-12-16 NOTE — Telephone Encounter (Signed)
Patient called to see if a foot cream was called in to her pharmacy

## 2018-12-18 NOTE — Progress Notes (Signed)
   Subjective:  Patient presents today status post excision of os peroneum left. DOS: 11/17/2018. She states she is doing well. She denies any significant pain or modifying factors. She notes some tightness that is caused by flexion of the foot. She has been using the CAM boot as directed. Patient is here for further evaluation and treatment.    No past medical history on file.    Objective/Physical Exam Neurovascular status intact.  Skin incisions appear to be well coapted. No sign of infectious process noted. No dehiscence. No active bleeding noted. Moderate edema noted to the surgical extremity.  Radiographic Exam:  Orthopedic hardware and osteotomies sites appear to be stable with routine healing.  Assessment: 1. s/p excision os peroneum left. DOS: 11/17/2018   Plan of Care:  1. Patient was evaluated. X-Rays reviewed.  2. Transition out of CAM boot.  3. Physical therapy ordered at [facility].  4. Return to clinic in 4 weeks.    [name], DPM [facility]  Dr. [name], [address] [address]                                        [place], [place] [address]                Office [phone]  Fax [phone]

## 2018-12-19 NOTE — Telephone Encounter (Signed)
Pt called states the cream was for athletes foot and had an antibiotic in it also.

## 2018-12-19 NOTE — Telephone Encounter (Signed)
Left message for pt to call with the type of cream.

## 2018-12-21 ENCOUNTER — Other Ambulatory Visit: Payer: Self-pay | Admitting: Podiatry

## 2018-12-21 MED ORDER — CLOTRIMAZOLE-BETAMETHASONE 1-0.05 % EX CREA: 1.0000 "application " | TOPICAL_CREAM | Freq: Two times a day (BID) | CUTANEOUS | 1 refills | Status: AC

## 2018-12-21 NOTE — Progress Notes (Signed)
PRN tinea pedis 

## 2018-12-21 NOTE — Telephone Encounter (Signed)
Rx sent in for Lotrisone Cream

## 2019-01-04 DIAGNOSIS — M79676 Pain in unspecified toe(s): Secondary | ICD-10-CM

## 2019-01-11 ENCOUNTER — Ambulatory Visit (INDEPENDENT_AMBULATORY_CARE_PROVIDER_SITE_OTHER): Payer: Managed Care, Other (non HMO)

## 2019-01-11 ENCOUNTER — Other Ambulatory Visit: Payer: Self-pay

## 2019-01-11 ENCOUNTER — Ambulatory Visit (INDEPENDENT_AMBULATORY_CARE_PROVIDER_SITE_OTHER): Payer: Managed Care, Other (non HMO) | Admitting: Podiatry

## 2019-01-11 DIAGNOSIS — M7752 Other enthesopathy of left foot: Secondary | ICD-10-CM

## 2019-01-11 DIAGNOSIS — Z9889 Other specified postprocedural states: Secondary | ICD-10-CM

## 2019-01-14 NOTE — Progress Notes (Signed)
   Subjective:  Patient presents today status post excision of os peroneum left. DOS: 11/17/2018. She states she is doing well but is still experiencing moderate pain. She has been doing physical therapy and states it has been going well. There are no modifying factors noted. Patient is here for further evaluation and treatment.   No past medical history on file.    Objective/Physical Exam Neurovascular status intact.  Skin incisions appear to be well coapted. No sign of infectious process noted. No dehiscence. No active bleeding noted. Moderate edema noted to the surgical extremity.  Radiographic Exam:  Orthopedic hardware and osteotomies sites appear to be stable with routine healing.  Assessment: 1. s/p excision os peroneum left. DOS: 11/17/2018   Plan of Care:  1. Patient was evaluated. X-Rays reviewed.  2. Continue physical therapy as needed twice weekly.  3. Appointment with Liliane Channel, Pedorthist, for custom molded orthotics.  4. Return to clinic in 4 weeks.    [name], DPM [facility]  Dr. [name], [place]                                        [place], [place] [address]                Office [phone]  Fax [phone]

## 2019-01-31 ENCOUNTER — Ambulatory Visit (INDEPENDENT_AMBULATORY_CARE_PROVIDER_SITE_OTHER): Payer: Managed Care, Other (non HMO) | Admitting: Orthotics

## 2019-01-31 ENCOUNTER — Other Ambulatory Visit: Payer: Self-pay

## 2019-01-31 DIAGNOSIS — M7752 Other enthesopathy of left foot: Secondary | ICD-10-CM

## 2019-01-31 DIAGNOSIS — M722 Plantar fascial fibromatosis: Secondary | ICD-10-CM

## 2019-01-31 DIAGNOSIS — M216X2 Other acquired deformities of left foot: Secondary | ICD-10-CM | POA: Diagnosis not present

## 2019-01-31 DIAGNOSIS — M216X1 Other acquired deformities of right foot: Secondary | ICD-10-CM | POA: Diagnosis not present

## 2019-01-31 DIAGNOSIS — Z9889 Other specified postprocedural states: Secondary | ICD-10-CM

## 2019-01-31 NOTE — Progress Notes (Signed)
Patient came into today to be cast for Custom Foot Orthotics. Upon recommendation of Dr. Mariane Masters presents with pes cavus foot type, lateral foot pain, hx sx os pern. Goals are longtidudna;l arch fill, RF stability, wedging to come off lateal side.  Plan vendor

## 2019-02-08 ENCOUNTER — Ambulatory Visit (INDEPENDENT_AMBULATORY_CARE_PROVIDER_SITE_OTHER): Payer: Self-pay | Admitting: Podiatry

## 2019-02-08 ENCOUNTER — Other Ambulatory Visit: Payer: Self-pay

## 2019-02-08 ENCOUNTER — Ambulatory Visit (INDEPENDENT_AMBULATORY_CARE_PROVIDER_SITE_OTHER): Payer: Managed Care, Other (non HMO)

## 2019-02-08 ENCOUNTER — Other Ambulatory Visit: Payer: Self-pay | Admitting: Podiatry

## 2019-02-08 DIAGNOSIS — M7672 Peroneal tendinitis, left leg: Secondary | ICD-10-CM | POA: Diagnosis not present

## 2019-02-08 DIAGNOSIS — M7752 Other enthesopathy of left foot: Secondary | ICD-10-CM

## 2019-02-08 DIAGNOSIS — M722 Plantar fascial fibromatosis: Secondary | ICD-10-CM

## 2019-02-08 DIAGNOSIS — Z9889 Other specified postprocedural states: Secondary | ICD-10-CM

## 2019-02-08 MED ORDER — DICLOFENAC SODIUM 75 MG PO TBEC: 75.0000 mg | DELAYED_RELEASE_TABLET | Freq: Two times a day (BID) | ORAL | 1 refills | Status: DC

## 2019-02-12 NOTE — Progress Notes (Signed)
   Subjective:  Patient presents today status post excision of os peroneum left. DOS: 11/17/2018. She states she is doing well. She reports soreness of the foot that occurs only with ambulation. She has been fitted for custom orthotics and has been doing physical therapy as directed. Resting the foot helps alleviate the pain. Patient is here for further evaluation and treatment.   No past medical history on file.    Objective/Physical Exam Neurovascular status intact.  Skin incisions appear to be well coapted. No sign of infectious process noted. No dehiscence. No active bleeding noted. Moderate edema noted to the surgical extremity. Pain with palpation noted to the right heel along the plantar fascia.   Radiographic Exam:  Normal osseous mineralization. Joint spaces preserved. No fracture/dislocation/boney destruction.    Assessment: 1. s/p excision os peroneum left. DOS: 11/17/2018 2. 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 Plantar fasciitis right - recurrent    Plan of Care:  1. Patient was evaluated. X-Rays reviewed.  2. Injection of 0.5 mLs Celestone Soluspan injected into the right heel.  3. Continue physical therapy.  4. Prescription for Diclofenac provided to patient. 5. Patient picking up custom orthotics on 02/21/2019.  6. Return to clinic in 4 weeks.    Felecia Shelling, DPM [facility]  Dr. Felecia Shelling, DPM    [address]                                        [place], [place] [address]                Office [phone]  Fax [phone]

## 2019-02-21 ENCOUNTER — Other Ambulatory Visit: Payer: Self-pay

## 2019-02-21 ENCOUNTER — Ambulatory Visit: Payer: Managed Care, Other (non HMO) | Admitting: Orthotics

## 2019-02-21 DIAGNOSIS — M7752 Other enthesopathy of left foot: Secondary | ICD-10-CM

## 2019-02-21 DIAGNOSIS — M7672 Peroneal tendinitis, left leg: Secondary | ICD-10-CM

## 2019-02-21 DIAGNOSIS — Z9889 Other specified postprocedural states: Secondary | ICD-10-CM

## 2019-02-21 DIAGNOSIS — M722 Plantar fascial fibromatosis: Secondary | ICD-10-CM

## 2019-02-21 NOTE — Progress Notes (Signed)
Patient came in today to pick up custom made foot orthotics.  The goals were accomplished and the patient reported no dissatisfaction with said orthotics.  Patient was advised of breakin period and how to report any issues. 

## 2019-03-08 ENCOUNTER — Encounter: Payer: Self-pay | Admitting: Podiatry

## 2019-03-08 ENCOUNTER — Ambulatory Visit (INDEPENDENT_AMBULATORY_CARE_PROVIDER_SITE_OTHER): Payer: Managed Care, Other (non HMO) | Admitting: Podiatry

## 2019-03-08 ENCOUNTER — Other Ambulatory Visit: Payer: Self-pay

## 2019-03-08 DIAGNOSIS — M722 Plantar fascial fibromatosis: Secondary | ICD-10-CM

## 2019-03-08 DIAGNOSIS — M7752 Other enthesopathy of left foot: Secondary | ICD-10-CM

## 2019-03-08 DIAGNOSIS — Z9889 Other specified postprocedural states: Secondary | ICD-10-CM

## 2019-03-12 NOTE — Progress Notes (Signed)
   Subjective:  Patient presents today status post excision of os peroneum left. DOS: 11/17/2018. She is also here for follow up evaluation of plantar fasciitis of right foot. She states she is doing well regarding both feet with some intermittent mild soreness. She has been doing physical therapy and taking Diclofenac for treatment. Being active on the feet increases the discomfort. Patient is here for further evaluation and treatment.   No past medical history on file.    Objective/Physical Exam Neurovascular status intact.  Skin incisions appear to be well coapted. No sign of infectious process noted. No dehiscence. No active bleeding noted. Moderate edema noted to the surgical extremity. Pain with palpation noted to the right heel along the plantar fascia.   Assessment: 1. s/p excision os peroneum left. DOS: 11/17/2018 2. 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 Plantar fasciitis right - recurrent    Plan of Care:  1. Patient was evaluated.  2. May resume full activity with no restrictions.  3. May return to work beginning on 03/20/2019.  4. Return to clinic as needed.    Felecia Shelling, DPM [facility]  Dr. Felecia Shelling, DPM    [address]                                        [place], [place] [address]                Office [phone]  Fax [phone]

## 2019-04-02 ENCOUNTER — Other Ambulatory Visit: Payer: Self-pay | Admitting: Podiatry

## 2019-04-04 NOTE — Telephone Encounter (Signed)
Rx for Diclofenac 75mg  sent toe [facility] in [address]

## 2021-01-01 IMAGING — MR MRI OF THE LEFT FOOT WITHOUT CONTRAST
4 of 5 series · 13 of 40 positions shown · non-contrast
Comparison: Left foot x-rays dated July 20, 2018.

CLINICAL DATA: Chronic posterior ankle pain. No relief after
plantar fascia surgery in [REDACTED].

EXAM:
MRI OF THE LEFT FOOT WITHOUT CONTRAST
TECHNIQUE: Multiplanar, multisequence MR imaging of the left ankle was
performed. No intravenous contrast was administered.

[Series 3: PD fat-sat · axial · left · 3.0mm · 0.25mm/px · z∈[-27,+68]mm · 4 of 30 slices shown]
[im 1/30]
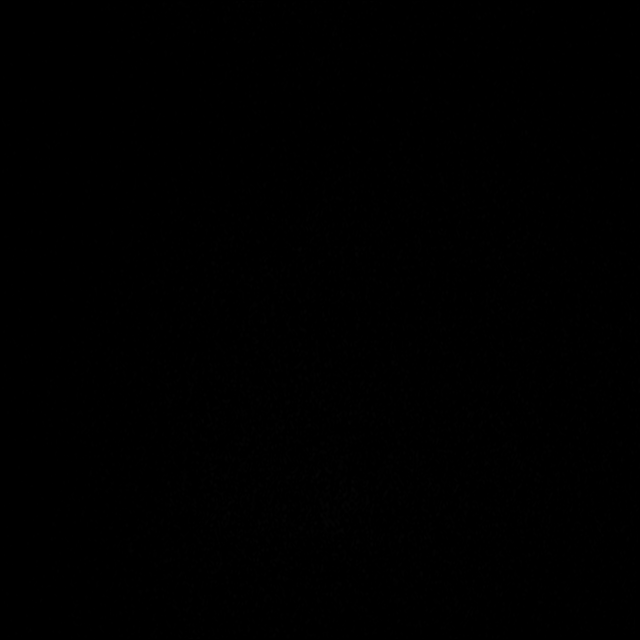
[im 5/30]
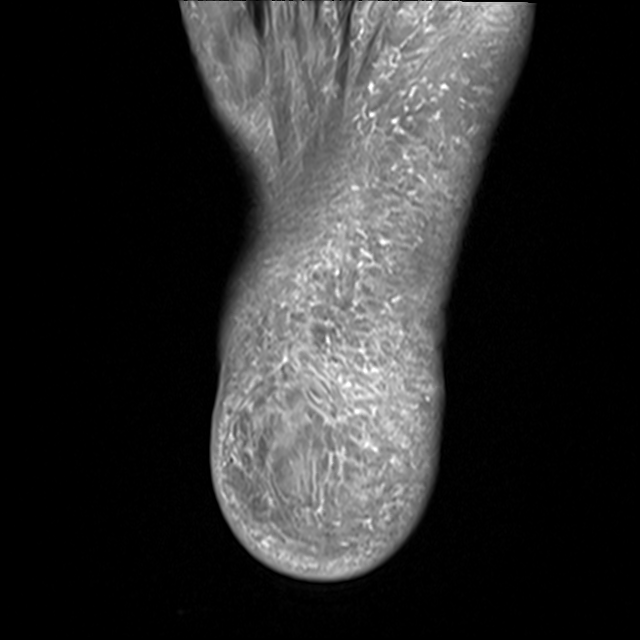
[im 17/30]
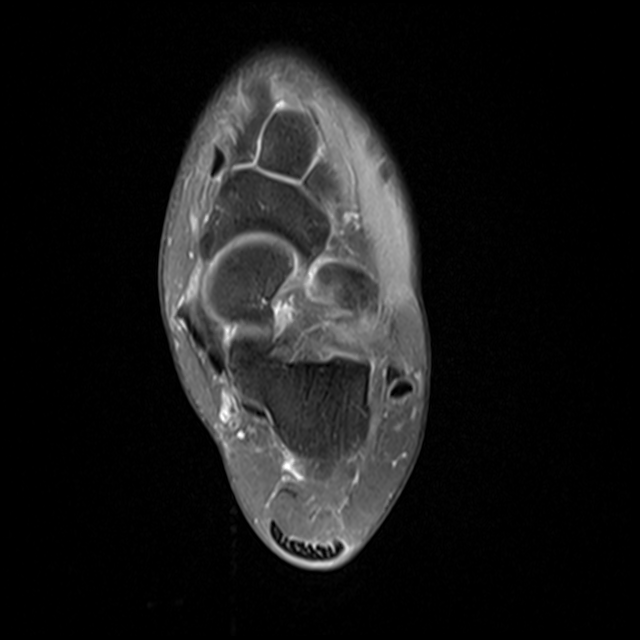
[im 25/30]
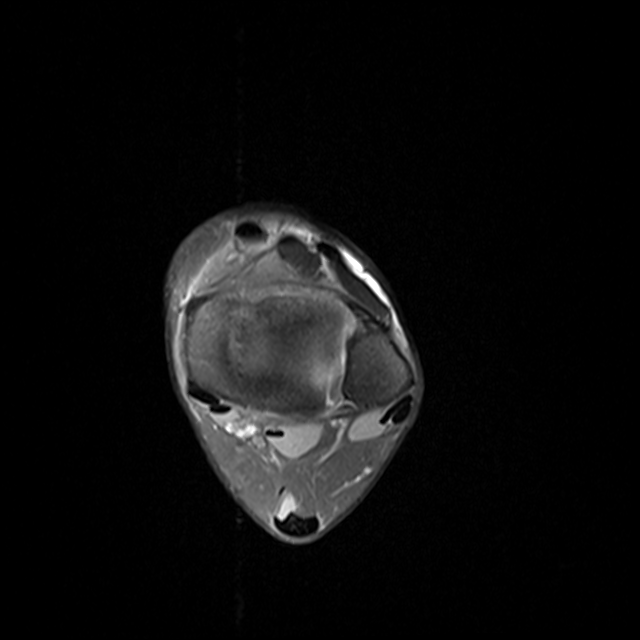

[Series 4: T2 fat-sat · axial · left · 3.0mm · 0.25mm/px · z∈[-15,+72]mm · 3 of 30 slices shown (1 of 2)]
[im 4/30]
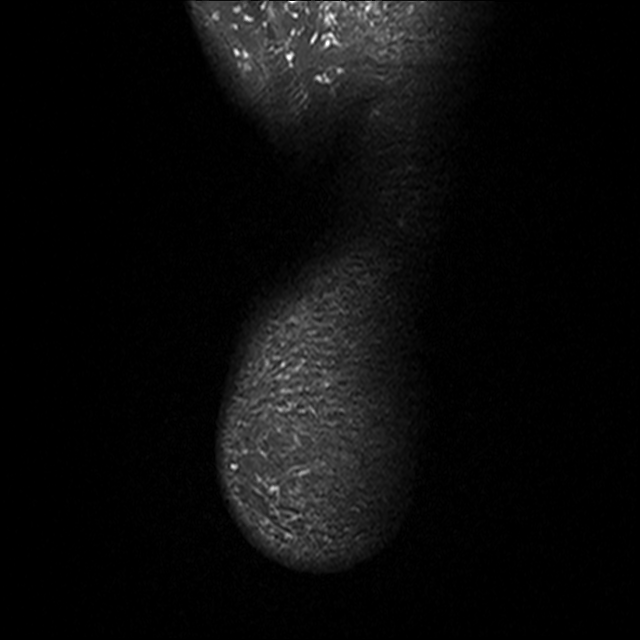
[im 15/30]
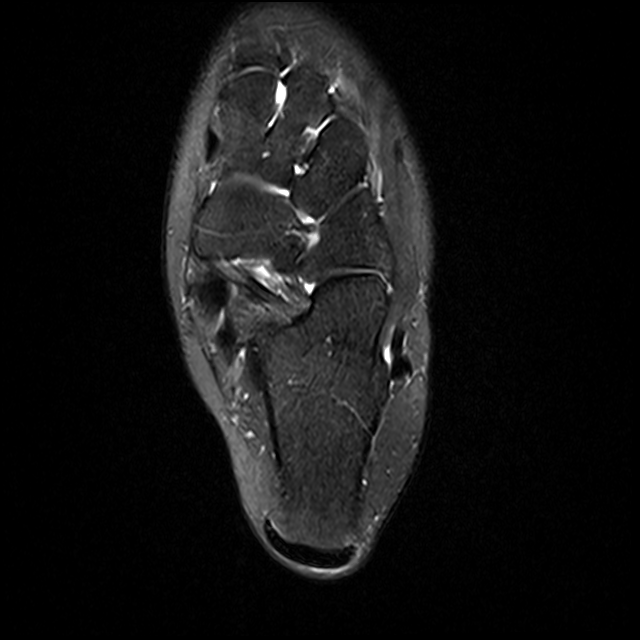
[im 26/30]
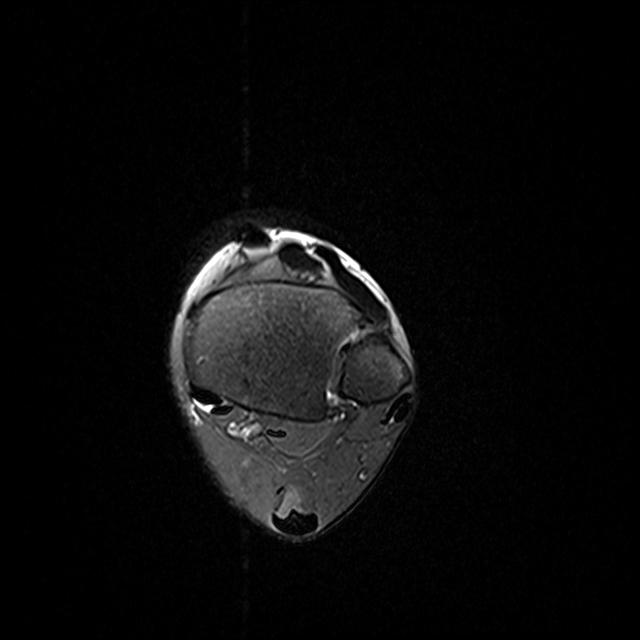

[Series 5: T1 · sagittal · left · 4.0mm · 0.27mm/px · 3 of 24 slices shown]
[im 4/24]
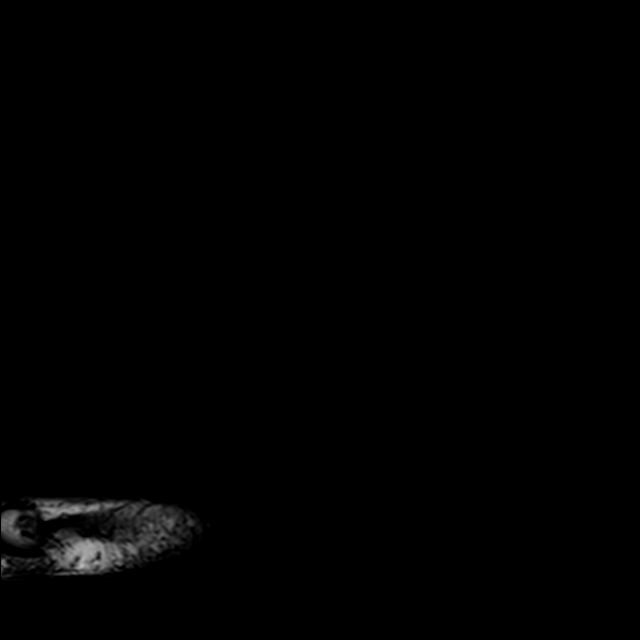
[im 12/24]
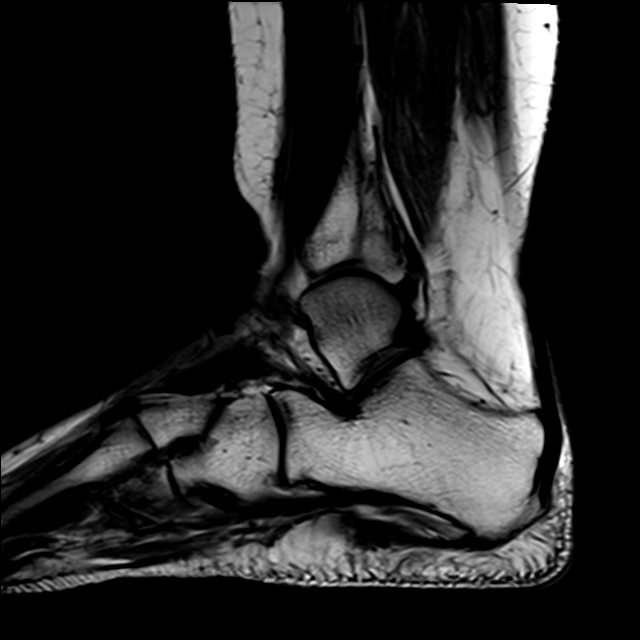
[im 20/24]
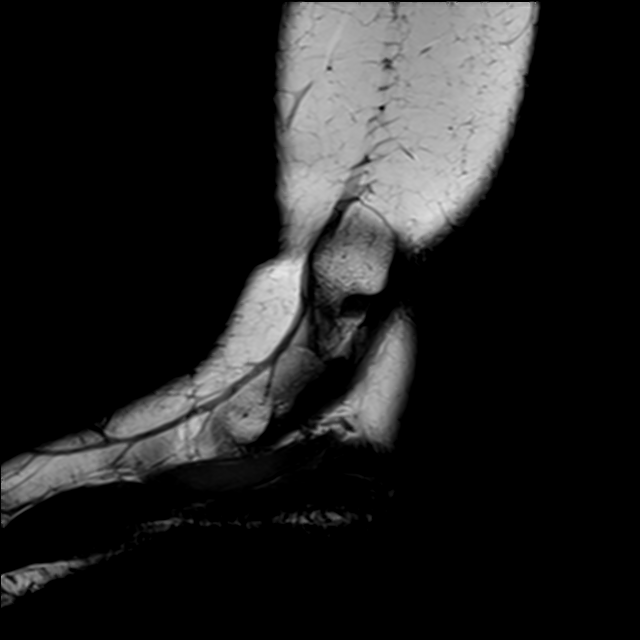

[Series 7: T2 fat-sat · coronal · left · 3.0mm · 0.25mm/px · 3 of 33 slices shown (2 of 2)]
[im 5/33]
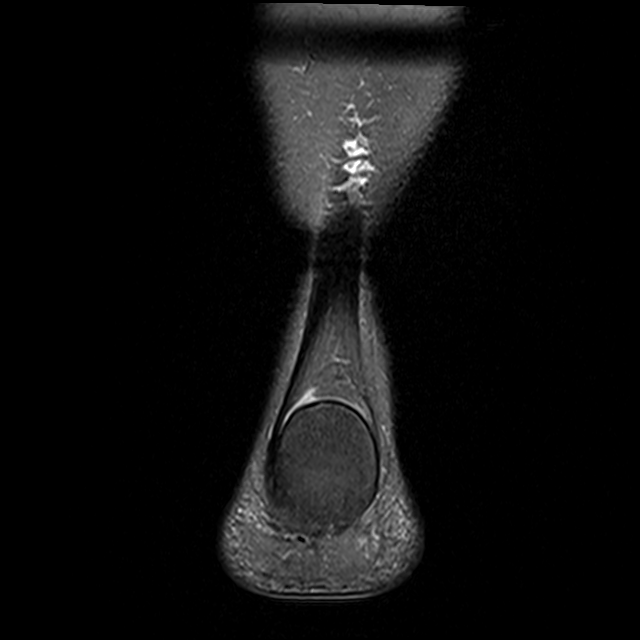
[im 17/33]
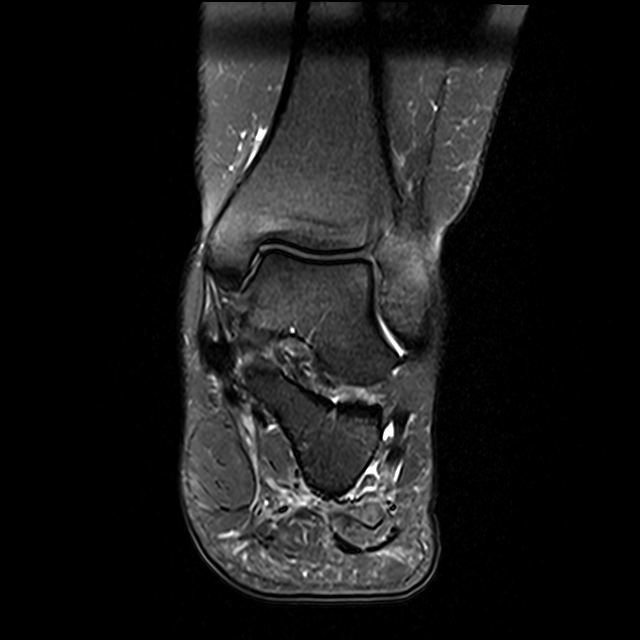
[im 29/33]
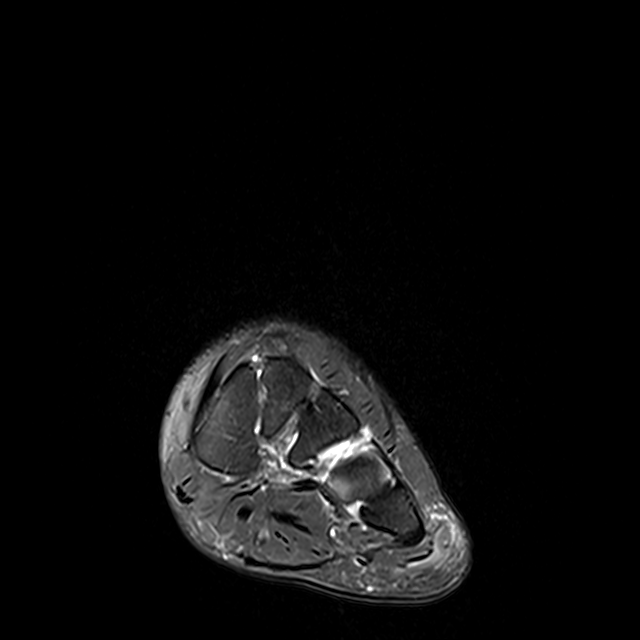

[13 of 40 positions shown; findings below may reference images not displayed]

FINDINGS: TENDONS

Peroneal: Peroneal longus tendon intact. Os peroneum. Peroneal
brevis intact.

Posteromedial: Posterior tibial tendon intact. Flexor hallucis
longus tendon intact. Flexor digitorum longus tendon intact.

Anterior: Tibialis anterior tendon intact. Extensor hallucis longus
tendon intact Extensor digitorum longus tendon intact.

Achilles:  Intact.

Plantar Fascia: Postsurgical changes related to prior plantar fascia
central band release.

LIGAMENTS

Lateral: Anterior talofibular ligament intact. Calcaneofibular
ligament intact. Posterior talofibular ligament intact. Anterior and
posterior tibiofibular ligaments intact.

Medial: Deltoid ligament intact. Spring ligament intact.

CARTILAGE

Ankle Joint: No joint effusion. Normal ankle mortise. No chondral
defect.

Subtalar Joints/Sinus Tarsi: Normal subtalar joints. No subtalar
joint effusion. Normal sinus tarsi.

Bones: No marrow signal abnormality.  No fracture or dislocation.

Soft Tissue: Small well-defined 7 x 4 x 13 mm fluid collection
between the os peroneum and distal peroneal brevis tendon (series 4,
image 21; series 7, image 23). No soft tissue mass.
IMPRESSION: 1. 7 x 4 x 13 mm fluid collection between the os peroneum and distal
peroneal brevis tendon which could represent a ganglion cyst or
sequelae of chronic irritation related to the os peroneum. The
peroneal tendons are unremarkable.
2. Prior plantar fascia release.
